# Patient Record
Sex: Male | Born: 1995 | Race: White | Hispanic: No | Marital: Single | Smoking: Never smoker
Health system: Southern US, Community
[De-identification: ages and names within clinical notes are randomized; demographics above are authoritative.]

## PROBLEM LIST (undated history)

## (undated) DIAGNOSIS — D649 Anemia, unspecified: Secondary | ICD-10-CM

## (undated) DIAGNOSIS — R51 Headache: Secondary | ICD-10-CM

## (undated) DIAGNOSIS — J45909 Unspecified asthma, uncomplicated: Secondary | ICD-10-CM

## (undated) HISTORY — DX: Headache: R51

## (undated) HISTORY — PX: CIRCUMCISION: SHX1350

---
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
## 2017-06-08 IMAGING — DX DG LUMBAR SPINE 2-3V
3 series · 3 of 3 positions shown · non-contrast
Comparison: Radiograph dated 03/20/2014

CLINICAL DATA: Back pain after a fall today.

EXAM:
LUMBAR SPINE - 2-3 VIEW

[l-spine ap]
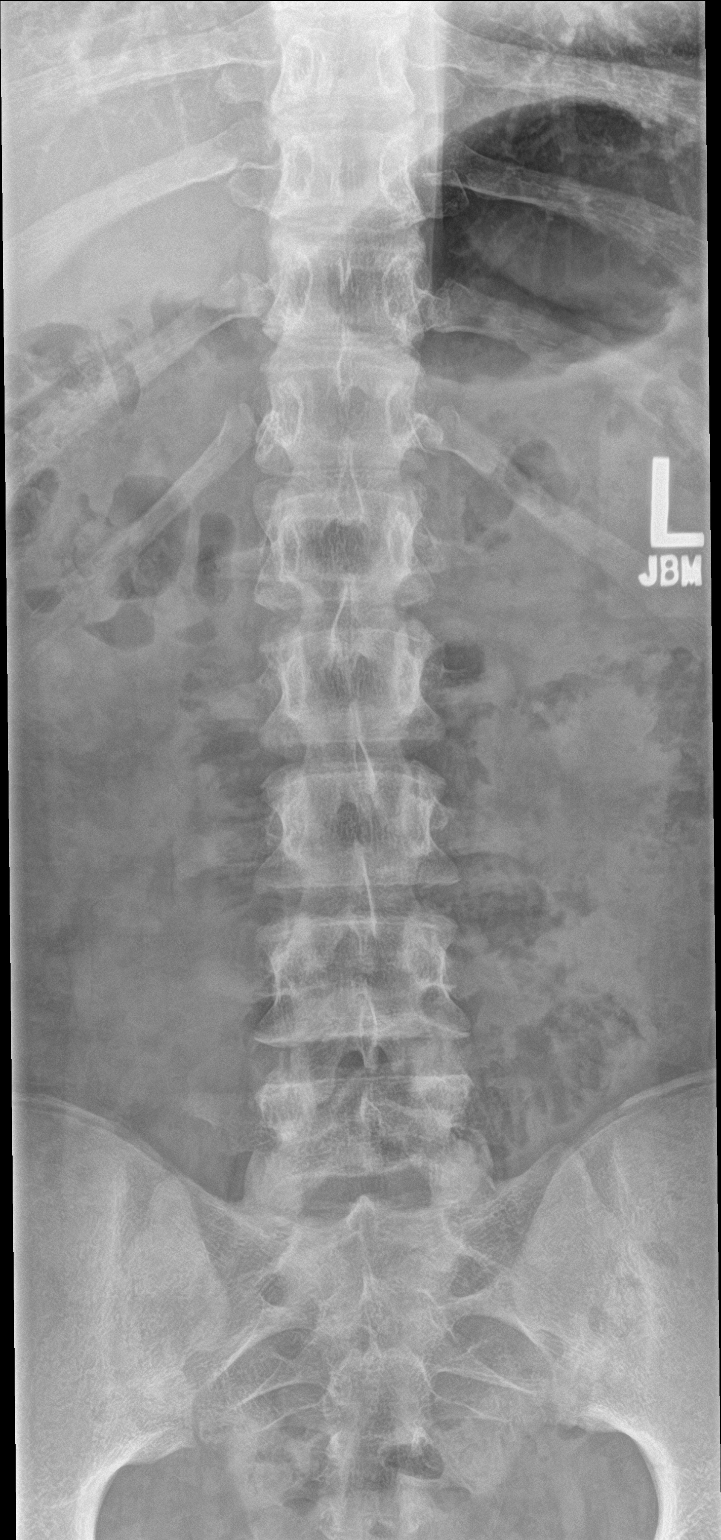

[l-spine lat]
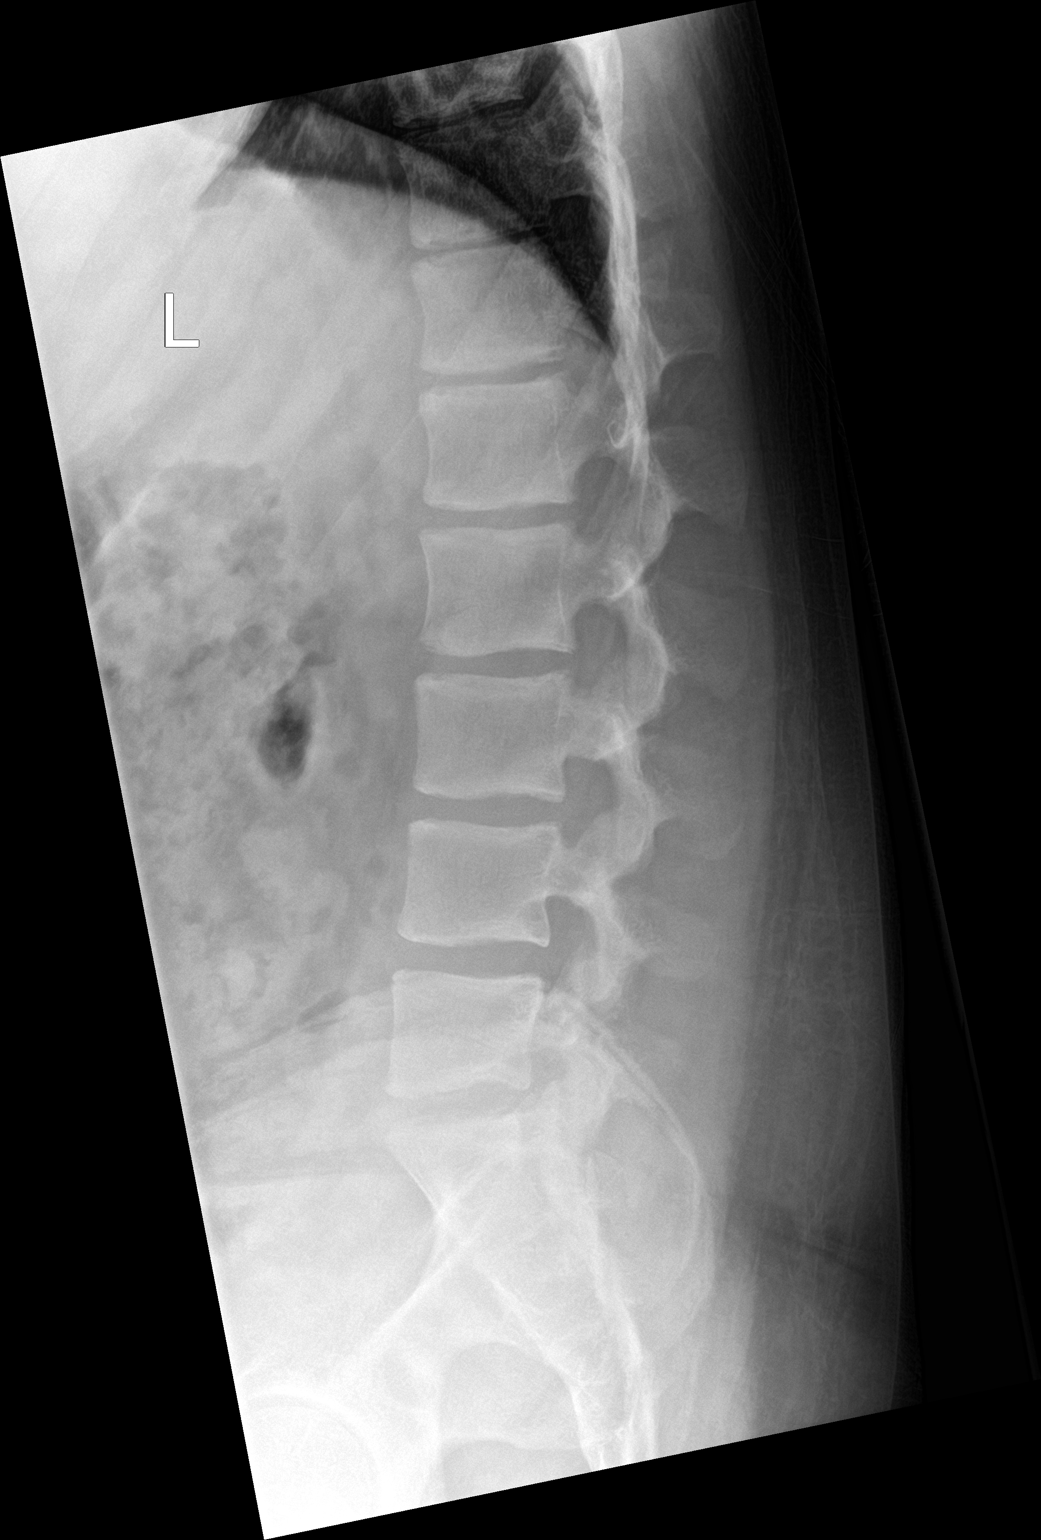

[l-spine spot]
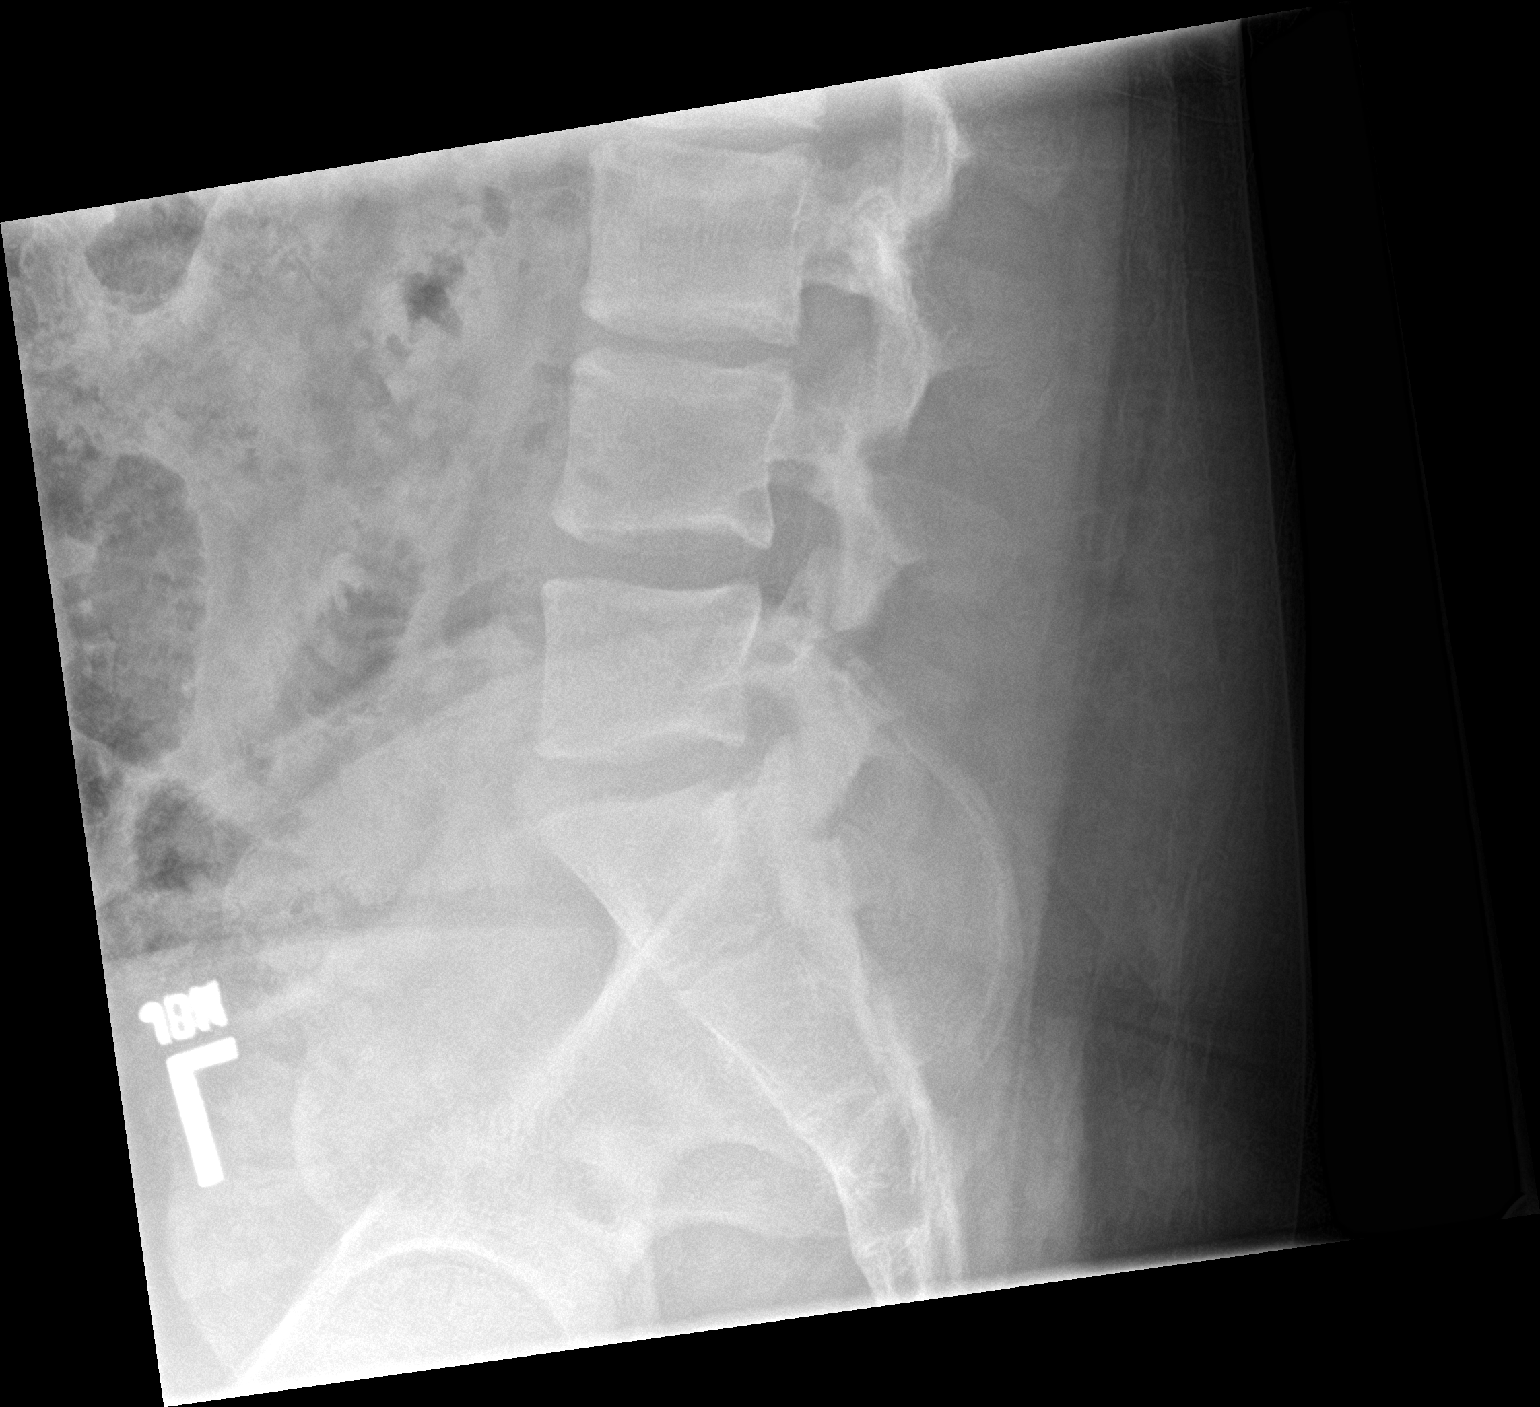

[3 of 3 positions shown; findings below may reference images not displayed]

FINDINGS: There is no evidence of lumbar spine fracture. Alignment is normal.
Intervertebral disc spaces are maintained.
IMPRESSION: Negative.

## 2017-06-08 IMAGING — DX DG THORACIC SPINE 2V
3 series · 3 of 3 positions shown · non-contrast
Comparison: Chest x-ray dated 03/20/2014

CLINICAL DATA: Back pain after a fall today.

EXAM:
THORACIC SPINE 2 VIEWS

[t-spine ap]
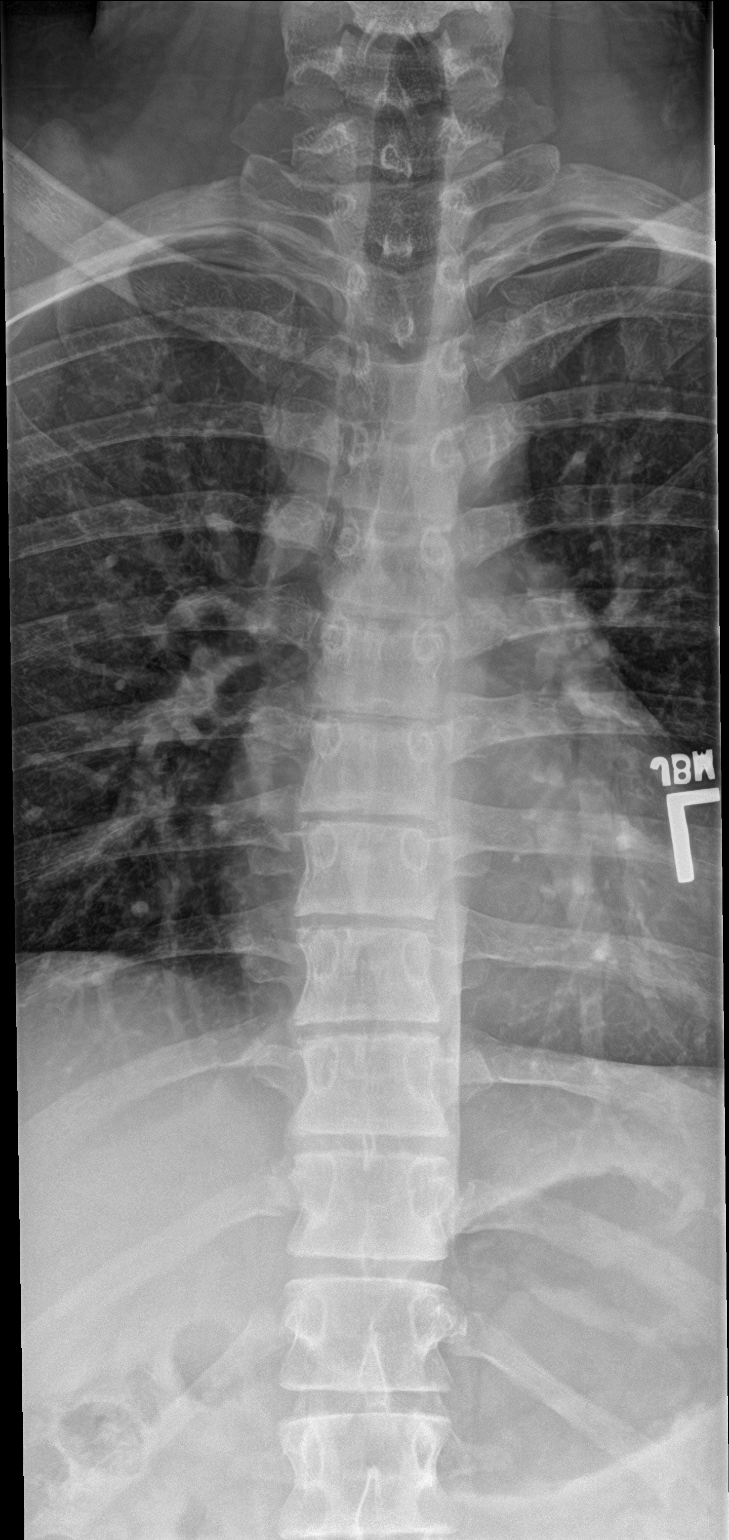

[t-spine lat]
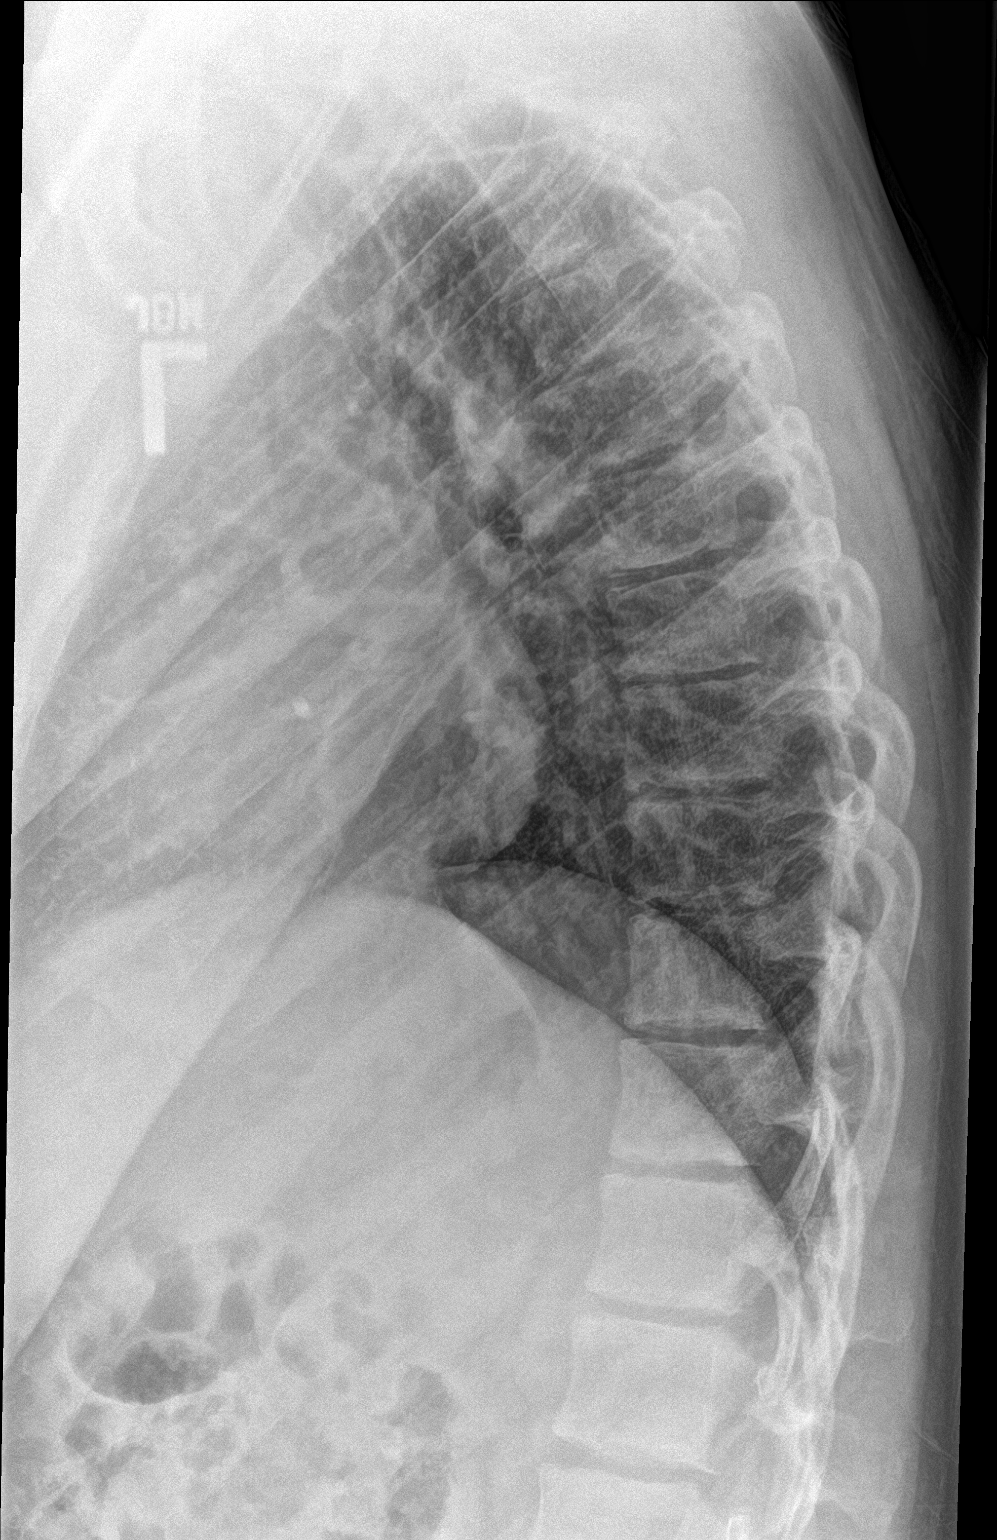

[t-spine swimmers]
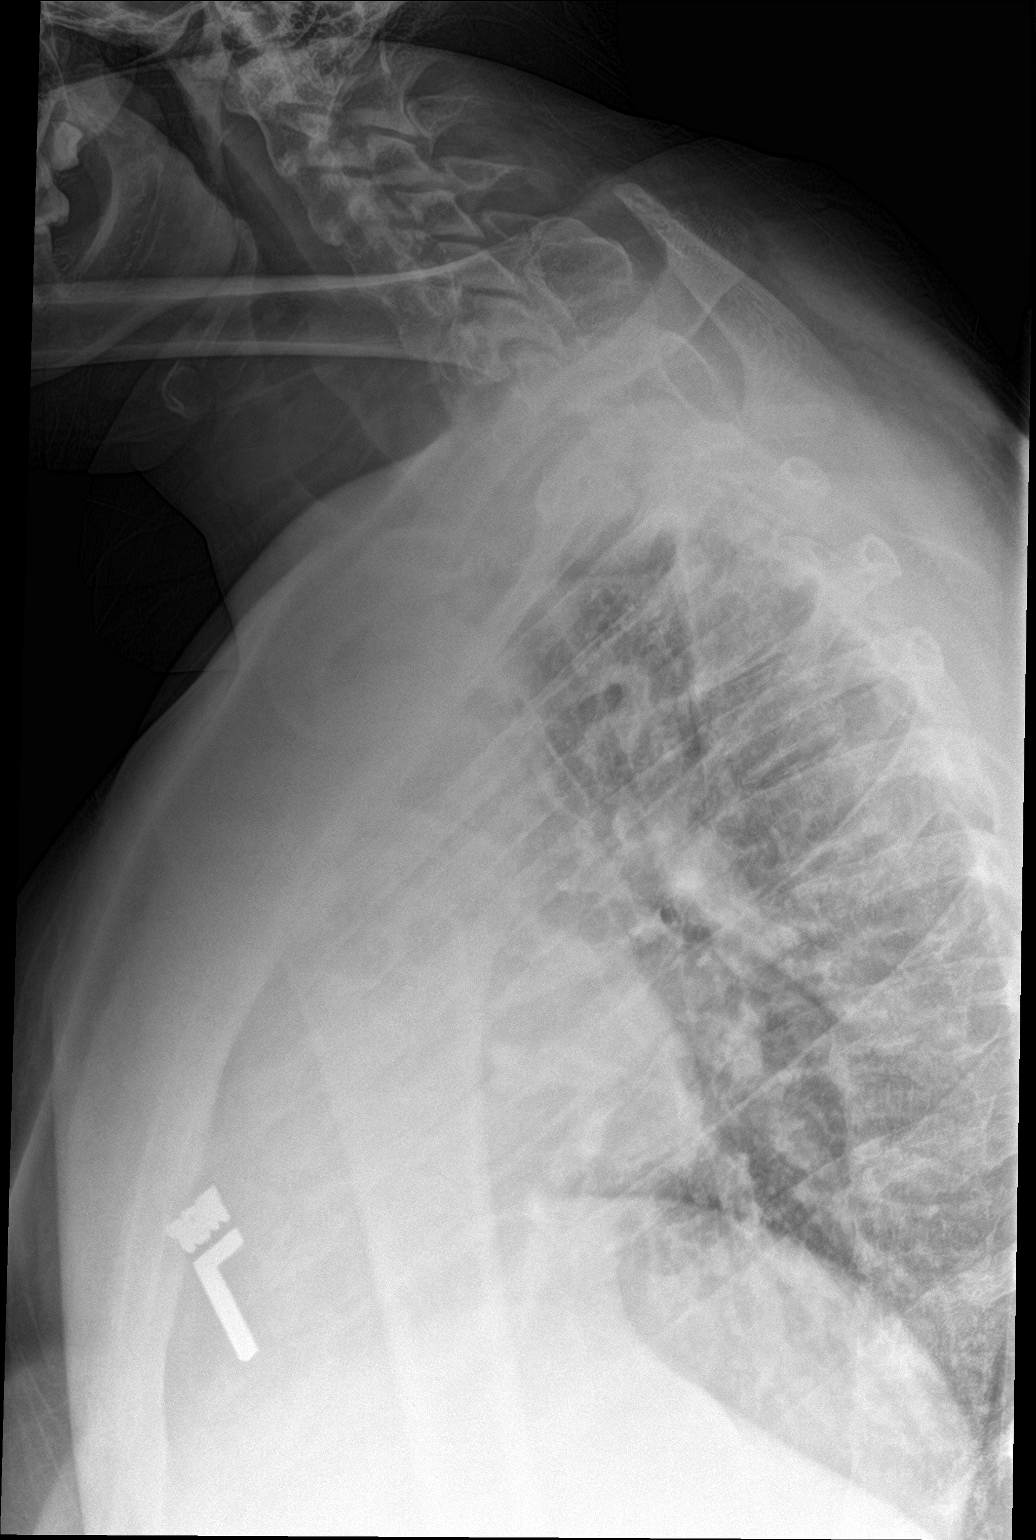

[3 of 3 positions shown; findings below may reference images not displayed]

FINDINGS: There is no evidence of thoracic spine fracture. Alignment is
normal. No other significant bone abnormalities are identified.
IMPRESSION: Negative.

## 2020-02-28 ENCOUNTER — Encounter: Payer: Self-pay | Admitting: Physician Assistant

## 2020-03-11 ENCOUNTER — Ambulatory Visit: Payer: Self-pay | Admitting: Physician Assistant

## 2020-11-27 HISTORY — PX: GALLBLADDER SURGERY: SHX652

## 2021-10-13 ENCOUNTER — Other Ambulatory Visit: Payer: Self-pay

## 2021-10-13 ENCOUNTER — Ambulatory Visit (INDEPENDENT_AMBULATORY_CARE_PROVIDER_SITE_OTHER): Payer: BC Managed Care – PPO | Admitting: Allergy

## 2021-10-13 ENCOUNTER — Encounter: Payer: Self-pay | Admitting: Allergy

## 2021-10-13 VITALS — BP 136/78 | HR 80 | Resp 20 | Ht 69.25 in | Wt 265.0 lb

## 2021-10-13 DIAGNOSIS — T781XXD Other adverse food reactions, not elsewhere classified, subsequent encounter: Secondary | ICD-10-CM | POA: Diagnosis not present

## 2021-10-13 DIAGNOSIS — J4520 Mild intermittent asthma, uncomplicated: Secondary | ICD-10-CM

## 2021-10-13 DIAGNOSIS — T63481A Toxic effect of venom of other arthropod, accidental (unintentional), initial encounter: Secondary | ICD-10-CM

## 2021-10-13 DIAGNOSIS — J301 Allergic rhinitis due to pollen: Secondary | ICD-10-CM

## 2021-10-13 MED ORDER — ALBUTEROL SULFATE HFA 108 (90 BASE) MCG/ACT IN AERS: 2.0000 | INHALATION_SPRAY | Freq: Four times a day (QID) | RESPIRATORY_TRACT | 2 refills | Status: AC | PRN

## 2021-10-13 MED ORDER — ALBUTEROL SULFATE HFA 108 (90 BASE) MCG/ACT IN AERS: 2.0000 | INHALATION_SPRAY | RESPIRATORY_TRACT | 2 refills | Status: AC | PRN

## 2021-10-13 MED ORDER — ALBUTEROL SULFATE HFA 108 (90 BASE) MCG/ACT IN AERS: 2.0000 | INHALATION_SPRAY | RESPIRATORY_TRACT | 2 refills | Status: DC | PRN

## 2021-10-13 MED ORDER — EPINEPHRINE 0.3 MG/0.3ML IJ SOAJ: 0.3000 mg | INTRAMUSCULAR | 1 refills | Status: AC | PRN

## 2021-10-13 NOTE — Addendum Note (Signed)
Addended by: Maryjean Morn D on: 10/13/2021 03:59 PM   Modules accepted: Orders

## 2021-10-13 NOTE — Addendum Note (Signed)
Addended by: Maryjean Morn D on: 10/13/2021 04:50 PM   Modules accepted: Orders

## 2021-10-13 NOTE — Progress Notes (Signed)
New Patient Note  RE: Jeremiah Boone MRN: [id number] DOB: 04-23-1996 Date of Office Visit: 10/13/2021  Referring provider: Iona Hansen, NP Primary care provider: Iona Hansen, NP  Chief Complaint: allergies  History of present illness: Jeremiah Boone is a 25 y.o. male presenting today for consultation for allergies.  He presents today with his father..  He would like to have a 'scratch test'.   He wants to see if he has developed new allergies.  He had testing done when he was a child and dad does not recall what he was positive too.   He does state on his last testing he was positive for mushrooms.  He states he doesn't know what happens with mushrooms as he is not sure if he is ever eaten a mushroom but was told it was positive on testing.  He would be interested in eating mushrooms if he is not truly allergic.  He states he was told he is allergic to yeast on previous testing as well but states he works for a [employer] and eats bread made with yeast without issue all the time.   He has been stung once inside his mouth and states his mouth and throat swelled and states he had trouble breathing.  He was provided with an epinephrine device at that time but states he does not have a current up-to-date one.  He reports being allergic to black mold and states when he is exposed he develops hives.  He states at his job there is an area where they throw away old bread and states this can be covered in mold and when he gets near it he develops hives and states he will take benadryl.  Benadryl does help.   He has a history of asthma and reports triggers include dry, humid days; stress; dusty environments.  He uses albuterol couple times a year on average. He has never required maintenance medications.    He does report runny nose with cough that is mostly seasonal in fall and spring.  He will take benadryl as needed.  He has taken claritin in the past that does help.  He states the runny  nose and cough is not terribly bothersome and thus does not feel he needs to use a nose spray.   Review of systems in the past 4 weeks: Review of Systems  Constitutional: Negative.   HENT:         Runny nose  Eyes: Negative.   Respiratory: Negative.    Cardiovascular: Negative.   Gastrointestinal: Negative.   Musculoskeletal: Negative.   Skin: Negative.   Neurological: Negative.    All other systems negative unless noted above in HPI  Past medical history: Past Medical History:  Diagnosis Date   Anemia    Asthma    Headache(784.0)     Past surgical history: Past Surgical History:  Procedure Laterality Date   CIRCUMCISION  52   GALLBLADDER SURGERY  11/27/2020   TONSILLECTOMY AND ADENOIDECTOMY Bilateral 12/27/2002    Family history:  Family History  Problem Relation Age of Onset   Heart Problems Mother    Other Mother        Had 1 tubal pregnancy   Food Allergy Mother        aspartame   Heart attack Maternal Grandfather    Asthma Paternal Grandmother    Emphysema Paternal Grandfather    Allergic rhinitis Neg Hx    Eczema Neg Hx    Angioedema Neg  Hx    Urticaria Neg Hx    Atopy Neg Hx    Immunodeficiency Neg Hx     Social history: Lives in a double wide trailer with carpeting with heat pump heating and central cooling.  2 dogs and 6 cats in the home.  There is no concern for water damage, mildew or roaches in the home.  He is a Marine scientist at [employer].  He denies a smoking history.   Medication List: Current Outpatient Medications  Medication Sig Dispense Refill   albuterol (VENTOLIN HFA) 108 (90 Base) MCG/ACT inhaler SMARTSIG:2 Puff(s) By Mouth Every 6 Hours PRN     ALPRAZolam (XANAX) 0.25 MG tablet Take 0.25 mg by mouth 3 (three) times daily as needed for anxiety. Take 1 or 2 tabs by mouth three times daily as needed     amitriptyline (ELAVIL) 25 MG tablet Take 1 tablet (25 mg total) by mouth at bedtime. (Start with 12.5 mg by mouth each  bedtime for the first week) 30 tablet 3   b complex vitamins tablet Take 1 tablet by mouth daily.     Dexmethylphenidate HCl (FOCALIN PO) Take 15 mg by mouth daily.     dicyclomine (BENTYL) 10 MG capsule Take 10 mg by mouth 4 (four) times daily as needed.     hydrOXYzine (ATARAX/VISTARIL) 25 MG tablet Take 25 mg by mouth every 8 (eight) hours as needed for anxiety.     ibuprofen (ADVIL,MOTRIN) 600 MG tablet Take 600 mg by mouth every 6 (six) hours as needed for pain.     ketoconazole (NIZORAL) 2 % cream APPLY   TOPICALLY TO AFFECTED AREA TWICE DAILY FOR 2 WEEKS     Melatonin 5 MG TABS Take 5 mg by mouth. Take 2 tabs by mouth at bedtime     PARoxetine (PAXIL) 10 MG tablet Take 10 mg by mouth daily.     riboflavin (VITAMIN B-2) 100 MG TABS tablet Take 100 mg by mouth daily.     SUMAtriptan (IMITREX) 50 MG tablet SMARTSIG:1 Tablet(s) By Mouth Every 2 Hours PRN     EPINEPHrine 0.3 mg/0.3 mL IJ SOAJ injection Inject into the muscle. (Patient not taking: Reported on 10/13/2021)     famotidine (PEPCID) 20 MG tablet TAKE 1 TABLET(20 MG) BY MOUTH DAILY PRN (Patient not taking: Reported on 10/13/2021)     loratadine (CLARITIN) 10 MG tablet Take 10 mg by mouth daily. (Patient not taking: Reported on 10/13/2021)     permethrin (ELIMITE) 5 % cream See admin instructions. (Patient not taking: Reported on 10/13/2021)     No current facility-administered medications for this visit.    Known medication allergies: Allergies  Allergen Reactions   Other Shortness Of Breath    Mushrooms, Mold, Spiders, Yeast, Bees     Physical examination: Blood pressure 136/78, pulse 80, resp. rate 20, height 5' 9.25" (1.759 m), weight 265 lb (120.2 kg), SpO2 99 %.  General: Alert, interactive, in no acute distress. HEENT: PERRLA, TMs pearly gray, turbinates non-edematous without discharge, post-pharynx non erythematous. Neck: Supple without lymphadenopathy. Lungs: Clear to auscultation without wheezing, rhonchi or  rales. {no increased work of breathing. CV: Normal S1, S2 without murmurs. Abdomen: Nondistended, nontender. Skin: Numerous hyperpigmented macules over extremities . Extremities:  No clubbing, cyanosis or edema. Neuro:   Grossly intact.  Diagnositics/Labs:  Spirometry: FEV1: 3.11 L 69%, FVC: 4.85 L 90%, ratio consistent with obstructive pattern.  Status post albuterol he had an increase in FEV1 to 4.2 L  or 93% which normalized  Allergy testing: Environmental allergy skin prick testing is positive to sweet vernal, maple. Mushroom skin prick testing is negative Allergy testing results were read and interpreted by provider, documented by clinical staff.   Assessment and plan: Allergic rhinitis Urticaria Mild intermittent asthma Food allergy Hymenoptera allergy   -environmental allergy testing is positive to tree and grass pollen -allergen avoidance measures discussed/handouts provided -for runny nose and cough can use nasal antihistamine, Astepro 1-2 sprays each nostril twice a day as needed.  This is an over-the-counter nasal spray -for general allergy symptom control and hive control can use long-acting antihistamine like Xyzal 5 mg, Allegra 180 mg or Zyrtec 10 mg daily as needed -will obtain mold IgE panel via blood work  -have access to albuterol inhaler 2 puffs every 4-6 hours as needed for cough/wheeze/shortness of breath/chest tightness.  May use 15-20 minutes prior to activity.   Monitor frequency of use.    Control goals:  Full participation in all desired activities (may need albuterol before activity) Albuterol use two time or less a week on average (not counting use with activity) Cough interfering with sleep two time or less a month Oral steroids no more than once a year No hospitalizations  -food allergy testing is negative to mushroom.   -you are not allergic to yeast as you tolerate breads made with yeast.  Will remove yeast from your allergy list.   -will obtain  mushroom IgE.  If negative recommend in-office challenge to mushroom (can bring in item like mushroom pizza).  Continue avoidance of mushroom until able to challenge -we have discussed the following in regards to foods:   Allergy: food allergy is when you have eaten a food, developed an allergic reaction after eating the food and have IgE to the food (positive food testing either by skin testing or blood testing).  Food allergy could lead to life threatening symptoms  Sensitivity: occurs when you have IgE to a food (positive food testing either by skin testing or blood testing) but is a food you eat without any issues.  This is not an allergy and we recommend keeping the food in the diet  Intolerance: this is when you have negative testing by either skin testing or blood testing thus not allergic but the food causes symptoms (like belly pain, bloating, diarrhea etc) with ingestion.  These foods should be avoided to prevent symptoms.    -will obtain stinging insect panel via bloodwork -would have access to epinephrine device -have access to self-injectable epinephrine (Epipen or AuviQ) 0.3mg  at all times -follow emergency action plan in case of allergic reaction  Follow-up in 6-12 months or sooner if needed   I appreciate the opportunity to take part in Jeremiah Boone's care. Please do not hesitate to contact me with questions.  Sincerely,   Margo Aye, MD Allergy/Immunology [facility]

## 2021-10-13 NOTE — Patient Instructions (Addendum)
-  environmental allergy testing is positive to tree and grass pollen -allergen avoidance measures discussed/handouts provided -for runny nose and cough can use nasal antihistamine, Astepro 1-2 sprays each nostril twice a day as needed.  This is an over-the-counter nasal spray -for general allergy symptom control and hive control can use long-acting antihistamine like Xyzal 5 mg, Allegra 180 mg or Zyrtec 10 mg daily as needed -will obtain mold IgE panel via blood work  -have access to albuterol inhaler 2 puffs every 4-6 hours as needed for cough/wheeze/shortness of breath/chest tightness.  May use 15-20 minutes prior to activity.   Monitor frequency of use.    Control goals:  Full participation in all desired activities (may need albuterol before activity) Albuterol use two time or less a week on average (not counting use with activity) Cough interfering with sleep two time or less a month Oral steroids no more than once a year No hospitalizations  -food allergy testing is negative to mushroom.   -you are not allergic to yeast as you tolerate breads made with yeast.  Will remove yeast from your allergy list.   -will obtain mushroom IgE.  If negative recommend in-office challenge to mushroom (can bring in item like mushroom pizza).  Continue avoidance of mushroom until able to challenge -we have discussed the following in regards to foods:   Allergy: food allergy is when you have eaten a food, developed an allergic reaction after eating the food and have IgE to the food (positive food testing either by skin testing or blood testing).  Food allergy could lead to life threatening symptoms  Sensitivity: occurs when you have IgE to a food (positive food testing either by skin testing or blood testing) but is a food you eat without any issues.  This is not an allergy and we recommend keeping the food in the diet  Intolerance: this is when you have negative testing by either skin testing or blood testing  thus not allergic but the food causes symptoms (like belly pain, bloating, diarrhea etc) with ingestion.  These foods should be avoided to prevent symptoms.    -will obtain stinging insect panel via bloodwork -would have access to epinephrine device -have access to self-injectable epinephrine (Epipen or AuviQ) 0.3mg  at all times -follow emergency action plan in case of allergic reaction  Follow-up in 6-12 months or sooner if needed

## 2021-10-20 ENCOUNTER — Ambulatory Visit: Payer: No Typology Code available for payment source | Admitting: Allergy & Immunology

## 2021-10-22 LAB — TRYPTASE: Tryptase: 5.2 ug/L (ref 2.2–13.2)

## 2021-10-22 LAB — ALLERGEN PROFILE, MOLD
Alternaria Alternata IgE: <0.1 kU/L
Aspergillus Fumigatus IgE: <0.1 kU/L
Aureobasidi Pullulans IgE: <0.1 kU/L
Candida Albicans IgE: <0.1 kU/L
Cladosporium Herbarum IgE: <0.1 kU/L
M009-IgE Fusarium proliferatum: <0.1 kU/L
M014-IgE Epicoccum purpur: <0.1 kU/L
Mucor Racemosus IgE: <0.1 kU/L
Penicillium Chrysogen IgE: <0.1 kU/L
Phoma Betae IgE: <0.1 kU/L
Setomelanomma Rostrat: <0.1 kU/L
Stemphylium Herbarum IgE: 0.11 kU/L — AB

## 2021-10-22 LAB — ALLERGEN HYMENOPTERA PANEL
Bumblebee: <0.1 kU/L
Honeybee IgE: <0.1 kU/L
Hornet, White Face, IgE: <0.1 kU/L
Hornet, Yellow, IgE: <0.1 kU/L
Paper Wasp IgE: <0.1 kU/L
Yellow Jacket, IgE: <0.1 kU/L

## 2021-10-22 LAB — ALLERGEN, MUSHROOM, RF212: Mushroom IgE: <0.1 kU/L

## 2021-11-18 NOTE — Progress Notes (Signed)
Called and informed patient's mom of results.  She went ahead and scheduled food challenge for January.

## 2022-01-12 ENCOUNTER — Encounter: Payer: BC Managed Care – PPO | Admitting: Allergy

## 2022-01-12 DIAGNOSIS — J309 Allergic rhinitis, unspecified: Secondary | ICD-10-CM

## 2022-04-13 ENCOUNTER — Ambulatory Visit: Payer: BC Managed Care – PPO | Admitting: Allergy

## 2022-04-13 DIAGNOSIS — J309 Allergic rhinitis, unspecified: Secondary | ICD-10-CM
